# Patient Record
Sex: Female | Born: 1995 | Race: Black or African American | Hispanic: No | Marital: Single | Smoking: Never smoker
Health system: Southern US, Community
[De-identification: ages and names within clinical notes are randomized; demographics above are authoritative.]

---

## 2020-03-06 ENCOUNTER — Encounter (HOSPITAL_COMMUNITY): Payer: Self-pay

## 2020-03-06 ENCOUNTER — Other Ambulatory Visit: Payer: Self-pay

## 2020-03-06 ENCOUNTER — Emergency Department (HOSPITAL_COMMUNITY)
Admission: EM | Admit: 2020-03-06 | Discharge: 2020-03-06 | Disposition: A | Discharge status: Home / Self Care | Payer: BC Managed Care – PPO | Attending: Emergency Medicine | Admitting: Emergency Medicine

## 2020-03-06 DIAGNOSIS — S0181XA Laceration without foreign body of other part of head, initial encounter: Secondary | ICD-10-CM | POA: Insufficient documentation

## 2020-03-06 DIAGNOSIS — Y92410 Unspecified street and highway as the place of occurrence of the external cause: Secondary | ICD-10-CM | POA: Insufficient documentation

## 2020-03-06 MED ORDER — LIDOCAINE-EPINEPHRINE 1 %-1:100000 IJ SOLN
10.0000 mL | Freq: Once | INTRAMUSCULAR | Status: AC
  Administered 2020-03-06: 10 mL
  Filled 2020-03-06: qty 1

## 2020-03-06 NOTE — ED Notes (Addendum)
Patients mom Shelbie Proctor [phone]

## 2020-03-06 NOTE — ED Notes (Signed)
Pt DC home; will go to Urgent Care or return here for suture removal on Friday.
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
## 2020-03-14 ENCOUNTER — Ambulatory Visit (HOSPITAL_COMMUNITY): Admit: 2020-03-14 | Discharge status: Against Medical Advice | Payer: No Typology Code available for payment source

## 2020-03-15 ENCOUNTER — Other Ambulatory Visit: Payer: Self-pay

## 2020-03-15 ENCOUNTER — Ambulatory Visit (HOSPITAL_COMMUNITY)
Admission: EM | Admit: 2020-03-15 | Discharge: 2020-03-15 | Disposition: A | Discharge status: Home / Self Care | Payer: BC Managed Care – PPO | Attending: Family Medicine | Admitting: Family Medicine

## 2020-03-15 DIAGNOSIS — Z4802 Encounter for removal of sutures: Secondary | ICD-10-CM

## 2020-03-15 NOTE — ED Triage Notes (Signed)
Patient in for suture removal from forehead laceration. Denies drainage  Wound appears clean, dry, approximated. No drainage.  Daryll Drown, PA in to triage to evaluate wound. Healing well, no need for future tx

## 2020-05-01 ENCOUNTER — Encounter (HOSPITAL_COMMUNITY): Payer: Self-pay | Admitting: Emergency Medicine

## 2020-05-01 ENCOUNTER — Emergency Department (HOSPITAL_COMMUNITY)
Admission: EM | Admit: 2020-05-01 | Discharge: 2020-05-02 | Disposition: A | Discharge status: Home / Self Care | Payer: BC Managed Care – PPO | Attending: Emergency Medicine | Admitting: Emergency Medicine

## 2020-05-01 ENCOUNTER — Other Ambulatory Visit: Payer: Self-pay

## 2020-05-01 DIAGNOSIS — IMO0001 Reserved for inherently not codable concepts without codable children: Secondary | ICD-10-CM

## 2020-05-01 DIAGNOSIS — O2341 Unspecified infection of urinary tract in pregnancy, first trimester: Secondary | ICD-10-CM

## 2020-05-01 DIAGNOSIS — Z3A01 Less than 8 weeks gestation of pregnancy: Secondary | ICD-10-CM | POA: Diagnosis not present

## 2020-05-01 DIAGNOSIS — O219 Vomiting of pregnancy, unspecified: Secondary | ICD-10-CM | POA: Diagnosis not present

## 2020-05-01 DIAGNOSIS — O200 Threatened abortion: Secondary | ICD-10-CM | POA: Diagnosis not present

## 2020-05-01 DIAGNOSIS — O418X10 Other specified disorders of amniotic fluid and membranes, first trimester, not applicable or unspecified: Secondary | ICD-10-CM

## 2020-05-01 DIAGNOSIS — O4691 Antepartum hemorrhage, unspecified, first trimester: Secondary | ICD-10-CM | POA: Diagnosis present

## 2020-05-01 NOTE — ED Provider Notes (Addendum)
[facility]-EMERGENCY DEPT Provider Note: Monica Dell, MD, FACEP  CSN: [id number] MRN: [id number] ARRIVAL: 05/01/20 at 1920 ROOM: WA19/WA19   CHIEF COMPLAINT  Vaginal Bleeding   HISTORY OF PRESENT ILLNESS  05/01/20 11:40 PM Monica Payne is a 24 y.o. female who reports a positive pregnancy test a week ago.  She is here requesting a pregnancy test.  She is having vaginal bleeding, nausea and vomiting with this.  She denies any pain.  She states the vaginal bleeding was heavy.  Nothing makes her symptoms better or worse.  The patient's nurse reports her urine specimen was foul-smelling.   History reviewed. No pertinent past medical history.  History reviewed. No pertinent surgical history.  No family history on file.  Social History   Tobacco Use  . Smoking status: Not on file  Substance Use Topics  . Alcohol use: Not on file  . Drug use: Not on file    Prior to Admission medications   Medication Sig Start Date End Date Taking? Authorizing Provider  Prenatal Vit-Fe Fumarate-FA (PRENATAL VITAMINS) 28-0.8 MG TABS Take 1 tablet by mouth daily. 05/02/20   Lonie Rummell, Jonny Ruiz, MD    Allergies Patient has no known allergies.   REVIEW OF SYSTEMS  Negative except as noted here or in the History of Present Illness.   PHYSICAL EXAMINATION  Initial Vital Signs Blood pressure 108/75, pulse 94, temperature 98 F (36.7 C), temperature source Oral, resp. rate 16, last menstrual period 03/23/2020, SpO2 99 %.  Examination General: Well-developed, well-nourished female in no acute distress; appearance consistent with age of record HENT: normocephalic; atraumatic Eyes: pupils equal, round and reactive to light; extraocular muscles intact Neck: supple Heart: regular rate and rhythm Lungs: clear to auscultation bilaterally Abdomen: soft; nondistended; nontender; no masses or hepatosplenomegaly; bowel sounds present GU: Normal external genitalia; small amount of vaginal bleeding;  cervical os closed; no cervical motion tenderness; no adnexal tenderness Extremities: No deformity; full range of motion; pulses normal Neurologic: Awake, alert and oriented; motor function intact in all extremities and symmetric; no facial droop Skin: Warm and dry Psychiatric: Normal mood and affect   RESULTS  Summary of this visit's results, reviewed and interpreted by myself:   EKG Interpretation  Date/Time:    Ventricular Rate:    PR Interval:    QRS Duration:   QT Interval:    QTC Calculation:   R Axis:     Text Interpretation:        Laboratory Studies: Results for orders placed or performed during the hospital encounter of 05/01/20 (from the past 24 hour(s))  Urinalysis, Routine w reflex microscopic Urine, Clean Catch     Status: Abnormal   Collection Time: 05/01/20 11:45 PM  Result Value Ref Range   Color, Urine AMBER (A) YELLOW   APPearance HAZY (A) CLEAR   Specific Gravity, Urine 1.026 1.005 - 1.030   pH 5.0 5.0 - 8.0   Glucose, UA NEGATIVE NEGATIVE mg/dL   Hgb urine dipstick LARGE (A) NEGATIVE   Bilirubin Urine NEGATIVE NEGATIVE   Ketones, ur 20 (A) NEGATIVE mg/dL   Protein, ur 333 (A) NEGATIVE mg/dL   Nitrite POSITIVE (A) NEGATIVE   Leukocytes,Ua TRACE (A) NEGATIVE   RBC / HPF 0-5 0 - 5 RBC/hpf   WBC, UA 21-50 0 - 5 WBC/hpf   Bacteria, UA MANY (A) NONE SEEN   Squamous Epithelial / LPF 0-5 0 - 5   Mucus PRESENT    Hyaline Casts, UA PRESENT   Wet prep,  genital     Status: Abnormal   Collection Time: 05/01/20 11:50 PM  Result Value Ref Range   Yeast Wet Prep HPF POC NONE SEEN NONE SEEN   Trich, Wet Prep NONE SEEN NONE SEEN   Clue Cells Wet Prep HPF POC NONE SEEN NONE SEEN   WBC, Wet Prep HPF POC FEW (A) NONE SEEN   Sperm NONE SEEN   POC urine preg, ED     Status: Abnormal   Collection Time: 05/02/20 12:00 AM  Result Value Ref Range   Preg Test, Ur POSITIVE (A) NEGATIVE  ABO/Rh     Status: None   Collection Time: 05/02/20  1:11 AM  Result Value Ref  Range   ABO/RH(D)      A POS Performed at [facility], [address], [place], [place] [address]   hCG, quantitative, pregnancy     Status: Abnormal   Collection Time: 05/02/20  1:11 AM  Result Value Ref Range   hCG, Beta Chain, Quant, S 50,149 (H) <5 mIU/mL   Imaging Studies: US OB Comp Less 14 Wks  Result Date: 05/02/2020 CLINICAL DATA:  Vaginal bleeding, positive pregnancy test EXAM: OBSTETRIC <14 WK ULTRASOUND TECHNIQUE: Transabdominal ultrasound was performed for evaluation of the gestation as well as the maternal uterus and adnexal regions. COMPARISON:  None. FINDINGS: Intrauterine gestational sac: Present Yolk sac:  Present Embryo:  Present Cardiac Activity: Present Heart Rate: 120 bpm CRL:   4.5 mm   6 w 1 d                  [place] EDC: 12/25/2020 Subchorionic hemorrhage: There is a large subchorionic hemorrhage identified which measures 3.7 cm in greatest dimension. Maternal uterus/adnexae: Within normal limits. No free fluid is noted. IMPRESSION: Single live intrauterine gestation at 6 weeks 1 day. Large subchorionic hemorrhage measuring 3.7 cm in greatest dimension. Electronically Signed   By: Alcide Clever M.D.   On: 05/02/2020 01:55    ED COURSE and MDM  Nursing notes, initial and subsequent vitals signs, including pulse oximetry, reviewed and interpreted by myself.  Vitals:   05/01/20 1925 05/01/20 2216  BP: (!) 143/95 108/75  Pulse: 77 94  Resp: 16 16  Temp: 98 F (36.7 C)   TempSrc: Oral   SpO2: 99% 99%   Medications  fosfomycin (MONUROL) packet 3 g (has no administration in time range)    1:59 AM Fosfomycin ordered for urinary tract infection in pregnancy.  Pelvic ultrasound pending.  Patient is Rh+, not a Rhophylac candidate.    PROCEDURES  Procedures   ED DIAGNOSES     ICD-10-CM   1. Threatened miscarriage  O20.0   2. Subchorionic hemorrhage of placenta in first trimester, single or unspecified fetus  O41.8X10    O46.8X1   3. Urinary  tract infection in mother during first trimester of pregnancy  O23.41        Hanz Winterhalter, MD 05/02/20 0215    Paula Libra, MD 05/02/20 0230

## 2020-05-01 NOTE — ED Notes (Signed)
Pt given a urine cup and instructed to provide a specimen when ready.

## 2020-05-01 NOTE — ED Triage Notes (Signed)
Patient reports positive pregnancy test x1 week ago. Reports pink discharge today.

## 2020-05-02 ENCOUNTER — Emergency Department (HOSPITAL_COMMUNITY): Payer: BC Managed Care – PPO

## 2020-05-02 LAB — URINALYSIS, ROUTINE W REFLEX MICROSCOPIC
Bilirubin Urine: NEGATIVE
Glucose, UA: NEGATIVE mg/dL
Ketones, ur: 20 mg/dL — AB
Nitrite: POSITIVE — AB
Protein, ur: 100 mg/dL — AB
Specific Gravity, Urine: 1.026 (ref 1.005–1.030)
pH: 5 (ref 5.0–8.0)

## 2020-05-02 LAB — POC URINE PREG, ED: Preg Test, Ur: POSITIVE — AB

## 2020-05-02 LAB — GC/CHLAMYDIA PROBE AMP (~~LOC~~) NOT AT ARMC
Chlamydia: NEGATIVE
Comment: NEGATIVE
Comment: NORMAL
Neisseria Gonorrhea: NEGATIVE

## 2020-05-02 LAB — ABO/RH: ABO/RH(D): A POS

## 2020-05-02 LAB — WET PREP, GENITAL
Clue Cells Wet Prep HPF POC: NONE SEEN
Sperm: NONE SEEN
Trich, Wet Prep: NONE SEEN
Yeast Wet Prep HPF POC: NONE SEEN

## 2020-05-02 LAB — HCG, QUANTITATIVE, PREGNANCY: hCG, Beta Chain, Quant, S: 50149 m[IU]/mL — ABNORMAL HIGH (ref <5)

## 2020-05-02 MED ORDER — PRENATAL VITAMINS 28-0.8 MG PO TABS: 1.0000 | ORAL_TABLET | Freq: Every day | ORAL | 0 refills | Status: AC

## 2020-05-02 MED ORDER — FOSFOMYCIN TROMETHAMINE 3 G PO PACK
3.0000 g | PACK | Freq: Once | ORAL | Status: AC
  Administered 2020-05-02: 3 g via ORAL
  Filled 2020-05-02: qty 3

## 2020-05-04 LAB — URINE CULTURE: Culture: >=100000 — AB

## 2020-05-05 ENCOUNTER — Telehealth: Payer: Self-pay

## 2020-05-05 NOTE — Telephone Encounter (Signed)
Post ED Visit - Positive Culture Follow-up  Culture report reviewed by antimicrobial stewardship pharmacist: [facility] []  , Pharm.D. []  Enzo Bi, Pharm.D., BCPS AQ-ID []  , Pharm.D., BCPS []  Celedonio Miyamoto, Pharm.D., BCPS []  [place], Garvin Fila.D., BCPS, AAHIVP []  , Pharm.D., BCPS, AAHIVP []  Georgina Pillion, PharmD, BCPS []  , PharmD, BCPS []  [place], PharmD, BCPS []  [address], PharmD []  , PharmD, BCPS []  Estella Husk, PharmD  Pharmacy Team []  Lysle Pearl, PharmD []  , PharmD []  Phillips Climes, PharmD []  , Rph []  Agapito Games) , PharmD []  Verlan Friends, PharmD []  , PharmD []  Mervyn Gay, PharmD []  , PharmD []  Vinnie Level, PharmD []  [facility], PharmD []  , PharmD [x]  Len Childs, PharmD   Positive urine culture Treated with Fosfomycin, organism sensitive to the same and no further patient follow-up is required at this time.  05/05/2020, 11:35 AM

## 2020-05-26 LAB — OB RESULTS CONSOLE RUBELLA ANTIBODY, IGM: Rubella: IMMUNE

## 2020-05-26 LAB — OB RESULTS CONSOLE GC/CHLAMYDIA
Chlamydia: NEGATIVE
Gonorrhea: NEGATIVE

## 2020-05-26 LAB — OB RESULTS CONSOLE ABO/RH: RH Type: POSITIVE

## 2020-05-26 LAB — OB RESULTS CONSOLE RPR: RPR: NONREACTIVE

## 2020-05-26 LAB — OB RESULTS CONSOLE ANTIBODY SCREEN: Antibody Screen: NEGATIVE

## 2020-05-26 LAB — OB RESULTS CONSOLE HIV ANTIBODY (ROUTINE TESTING): HIV: NONREACTIVE

## 2020-05-26 LAB — OB RESULTS CONSOLE HEPATITIS B SURFACE ANTIGEN: Hepatitis B Surface Ag: NEGATIVE

## 2020-11-22 LAB — OB RESULTS CONSOLE GBS: GBS: NEGATIVE

## 2020-12-20 ENCOUNTER — Inpatient Hospital Stay (HOSPITAL_BASED_OUTPATIENT_CLINIC_OR_DEPARTMENT_OTHER): Payer: BC Managed Care – PPO

## 2020-12-20 ENCOUNTER — Inpatient Hospital Stay (HOSPITAL_COMMUNITY): Payer: BC Managed Care – PPO | Admitting: Anesthesiology

## 2020-12-20 ENCOUNTER — Encounter (HOSPITAL_COMMUNITY)
Admission: AD | Disposition: A | Discharge status: Home / Self Care | Payer: Self-pay | Source: Home / Self Care | Attending: Obstetrics and Gynecology

## 2020-12-20 ENCOUNTER — Encounter (HOSPITAL_COMMUNITY): Payer: Self-pay | Admitting: *Deleted

## 2020-12-20 ENCOUNTER — Encounter (HOSPITAL_COMMUNITY): Payer: Self-pay | Admitting: Anesthesiology

## 2020-12-20 ENCOUNTER — Telehealth (HOSPITAL_COMMUNITY): Payer: Self-pay | Admitting: *Deleted

## 2020-12-20 ENCOUNTER — Other Ambulatory Visit (HOSPITAL_COMMUNITY): Payer: BC Managed Care – PPO | Attending: Obstetrics and Gynecology

## 2020-12-20 ENCOUNTER — Other Ambulatory Visit: Payer: Self-pay

## 2020-12-20 ENCOUNTER — Inpatient Hospital Stay (HOSPITAL_COMMUNITY)
Admission: AD | Admit: 2020-12-20 | Discharge: 2020-12-23 | DRG: 788 | Disposition: A | Discharge status: Home / Self Care | Payer: BC Managed Care – PPO | Attending: Obstetrics and Gynecology | Admitting: Obstetrics and Gynecology

## 2020-12-20 ENCOUNTER — Encounter (HOSPITAL_COMMUNITY): Payer: Self-pay | Admitting: Obstetrics and Gynecology

## 2020-12-20 DIAGNOSIS — O26893 Other specified pregnancy related conditions, third trimester: Secondary | ICD-10-CM | POA: Diagnosis present

## 2020-12-20 DIAGNOSIS — Z20822 Contact with and (suspected) exposure to covid-19: Secondary | ICD-10-CM | POA: Diagnosis present

## 2020-12-20 DIAGNOSIS — O321XX0 Maternal care for breech presentation, not applicable or unspecified: Secondary | ICD-10-CM | POA: Diagnosis present

## 2020-12-20 DIAGNOSIS — Z3A40 40 weeks gestation of pregnancy: Secondary | ICD-10-CM | POA: Diagnosis not present

## 2020-12-20 DIAGNOSIS — Z3689 Encounter for other specified antenatal screening: Secondary | ICD-10-CM

## 2020-12-20 LAB — CBC
HCT: 35.6 % — ABNORMAL LOW (ref 36.0–46.0)
Hemoglobin: 12.2 g/dL (ref 12.0–15.0)
MCH: 29.8 pg (ref 26.0–34.0)
MCHC: 34.3 g/dL (ref 30.0–36.0)
MCV: 87 fL (ref 80.0–100.0)
Platelets: 157 10*3/uL (ref 150–400)
RBC: 4.09 MIL/uL (ref 3.87–5.11)
RDW: 13 % (ref 11.5–15.5)
WBC: 8.3 10*3/uL (ref 4.0–10.5)
nRBC: 0 % (ref 0.0–0.2)

## 2020-12-20 LAB — TYPE AND SCREEN
ABO/RH(D): A POS
Antibody Screen: NEGATIVE

## 2020-12-20 LAB — RESP PANEL BY RT-PCR (FLU A&B, COVID) ARPGX2
Influenza A by PCR: NEGATIVE
Influenza B by PCR: NEGATIVE
SARS Coronavirus 2 by RT PCR: NEGATIVE

## 2020-12-20 SURGERY — Surgical Case
Anesthesia: Spinal

## 2020-12-20 MED ORDER — KETOROLAC TROMETHAMINE 30 MG/ML IJ SOLN: 30.0000 mg | Freq: Four times a day (QID) | INTRAMUSCULAR | Status: DC | PRN

## 2020-12-20 MED ORDER — DIPHENHYDRAMINE HCL 50 MG/ML IJ SOLN: 12.5000 mg | INTRAMUSCULAR | Status: DC | PRN

## 2020-12-20 MED ORDER — MENTHOL 3 MG MT LOZG: 1.0000 | LOZENGE | OROMUCOSAL | Status: DC | PRN

## 2020-12-20 MED ORDER — NALBUPHINE HCL 10 MG/ML IJ SOLN: 5.0000 mg | INTRAMUSCULAR | Status: DC | PRN

## 2020-12-20 MED ORDER — ACETAMINOPHEN 500 MG PO TABS
1000.0000 mg | ORAL_TABLET | Freq: Four times a day (QID) | ORAL | Status: DC
  Administered 2020-12-20 – 2020-12-23 (×10): 1000 mg via ORAL
  Filled 2020-12-20 (×11): qty 2

## 2020-12-20 MED ORDER — OXYCODONE-ACETAMINOPHEN 5-325 MG PO TABS: 1.0000 | ORAL_TABLET | ORAL | Status: DC | PRN

## 2020-12-20 MED ORDER — IBUPROFEN 600 MG PO TABS
600.0000 mg | ORAL_TABLET | Freq: Four times a day (QID) | ORAL | Status: DC
  Administered 2020-12-21 – 2020-12-23 (×7): 600 mg via ORAL
  Filled 2020-12-20 (×8): qty 1

## 2020-12-20 MED ORDER — KETOROLAC TROMETHAMINE 30 MG/ML IJ SOLN
30.0000 mg | Freq: Four times a day (QID) | INTRAMUSCULAR | Status: AC
  Administered 2020-12-20 – 2020-12-21 (×3): 30 mg via INTRAVENOUS
  Filled 2020-12-20 (×3): qty 1

## 2020-12-20 MED ORDER — ZOLPIDEM TARTRATE 5 MG PO TABS: 5.0000 mg | ORAL_TABLET | Freq: Every evening | ORAL | Status: DC | PRN

## 2020-12-20 MED ORDER — OXYCODONE HCL 5 MG PO TABS
5.0000 mg | ORAL_TABLET | ORAL | Status: DC | PRN
  Administered 2020-12-21 – 2020-12-22 (×3): 5 mg via ORAL
  Filled 2020-12-20 (×4): qty 1

## 2020-12-20 MED ORDER — WITCH HAZEL-GLYCERIN EX PADS: 1.0000 "application " | MEDICATED_PAD | CUTANEOUS | Status: DC | PRN

## 2020-12-20 MED ORDER — LACTATED RINGERS IV SOLN: INTRAVENOUS | Status: DC

## 2020-12-20 MED ORDER — MORPHINE SULFATE (PF) 0.5 MG/ML IJ SOLN
INTRAMUSCULAR | Status: AC
Start: 2020-12-20 — End: ?
  Filled 2020-12-20: qty 10

## 2020-12-20 MED ORDER — OXYTOCIN-SODIUM CHLORIDE 30-0.9 UT/500ML-% IV SOLN
2.5000 [IU]/h | INTRAVENOUS | Status: DC
  Filled 2020-12-20: qty 500

## 2020-12-20 MED ORDER — ONDANSETRON HCL 4 MG/2ML IJ SOLN: 4.0000 mg | Freq: Three times a day (TID) | INTRAMUSCULAR | Status: DC | PRN

## 2020-12-20 MED ORDER — OXYTOCIN-SODIUM CHLORIDE 30-0.9 UT/500ML-% IV SOLN
INTRAVENOUS | Status: DC | PRN
  Administered 2020-12-20: 30 [IU] via INTRAVENOUS

## 2020-12-20 MED ORDER — LIDOCAINE HCL (PF) 1 % IJ SOLN: 30.0000 mL | INTRAMUSCULAR | Status: DC | PRN

## 2020-12-20 MED ORDER — SCOPOLAMINE 1 MG/3DAYS TD PT72
1.0000 | MEDICATED_PATCH | TRANSDERMAL | Status: DC
  Administered 2020-12-20: 1.5 mg via TRANSDERMAL
  Filled 2020-12-20: qty 1

## 2020-12-20 MED ORDER — NALBUPHINE HCL 10 MG/ML IJ SOLN: 5.0000 mg | Freq: Once | INTRAMUSCULAR | Status: DC | PRN

## 2020-12-20 MED ORDER — ONDANSETRON HCL 4 MG/2ML IJ SOLN: 4.0000 mg | Freq: Four times a day (QID) | INTRAMUSCULAR | Status: DC | PRN

## 2020-12-20 MED ORDER — SIMETHICONE 80 MG PO CHEW: 80.0000 mg | CHEWABLE_TABLET | ORAL | Status: DC | PRN

## 2020-12-20 MED ORDER — DIPHENHYDRAMINE HCL 25 MG PO CAPS: 25.0000 mg | ORAL_CAPSULE | ORAL | Status: DC | PRN

## 2020-12-20 MED ORDER — NALOXONE HCL 0.4 MG/ML IJ SOLN: 0.4000 mg | INTRAMUSCULAR | Status: DC | PRN

## 2020-12-20 MED ORDER — LACTATED RINGERS IV SOLN: 500.0000 mL | INTRAVENOUS | Status: DC | PRN

## 2020-12-20 MED ORDER — TETANUS-DIPHTH-ACELL PERTUSSIS 5-2.5-18.5 LF-MCG/0.5 IM SUSY: 0.5000 mL | PREFILLED_SYRINGE | Freq: Once | INTRAMUSCULAR | Status: DC

## 2020-12-20 MED ORDER — FENTANYL CITRATE (PF) 100 MCG/2ML IJ SOLN
INTRAMUSCULAR | Status: DC | PRN
  Administered 2020-12-20: 15 ug via INTRATHECAL

## 2020-12-20 MED ORDER — PRENATAL MULTIVITAMIN CH
1.0000 | ORAL_TABLET | Freq: Every day | ORAL | Status: DC
  Administered 2020-12-21 – 2020-12-23 (×3): 1 via ORAL
  Filled 2020-12-20 (×3): qty 1

## 2020-12-20 MED ORDER — DIPHENHYDRAMINE HCL 25 MG PO CAPS: 25.0000 mg | ORAL_CAPSULE | Freq: Four times a day (QID) | ORAL | Status: DC | PRN

## 2020-12-20 MED ORDER — PHENYLEPHRINE HCL-NACL 20-0.9 MG/250ML-% IV SOLN
INTRAVENOUS | Status: AC
  Filled 2020-12-20: qty 250

## 2020-12-20 MED ORDER — OXYTOCIN BOLUS FROM INFUSION: 333.0000 mL | Freq: Once | INTRAVENOUS | Status: DC

## 2020-12-20 MED ORDER — SIMETHICONE 80 MG PO CHEW
80.0000 mg | CHEWABLE_TABLET | Freq: Three times a day (TID) | ORAL | Status: DC
  Administered 2020-12-21 – 2020-12-23 (×4): 80 mg via ORAL
  Filled 2020-12-20 (×4): qty 1

## 2020-12-20 MED ORDER — BUPIVACAINE IN DEXTROSE 0.75-8.25 % IT SOLN
INTRATHECAL | Status: DC | PRN
  Administered 2020-12-20: 1.6 mg via INTRATHECAL

## 2020-12-20 MED ORDER — ONDANSETRON HCL 4 MG/2ML IJ SOLN
INTRAMUSCULAR | Status: DC | PRN
  Administered 2020-12-20: 4 mg via INTRAVENOUS

## 2020-12-20 MED ORDER — OXYTOCIN-SODIUM CHLORIDE 30-0.9 UT/500ML-% IV SOLN
2.5000 [IU]/h | INTRAVENOUS | Status: AC
  Administered 2020-12-20: 2.5 [IU]/h via INTRAVENOUS
  Filled 2020-12-20: qty 500

## 2020-12-20 MED ORDER — SODIUM CHLORIDE 0.9% FLUSH: 3.0000 mL | INTRAVENOUS | Status: DC | PRN

## 2020-12-20 MED ORDER — PHENYLEPHRINE HCL-NACL 20-0.9 MG/250ML-% IV SOLN
INTRAVENOUS | Status: DC | PRN
Start: 2020-12-20 — End: 2020-12-20
  Administered 2020-12-20: 60 ug/min via INTRAVENOUS

## 2020-12-20 MED ORDER — CEFAZOLIN SODIUM-DEXTROSE 2-4 GM/100ML-% IV SOLN
INTRAVENOUS | Status: AC
  Filled 2020-12-20: qty 100

## 2020-12-20 MED ORDER — CEFAZOLIN SODIUM-DEXTROSE 2-3 GM-%(50ML) IV SOLR
INTRAVENOUS | Status: DC | PRN
  Administered 2020-12-20: 2 g via INTRAVENOUS

## 2020-12-20 MED ORDER — DEXAMETHASONE SODIUM PHOSPHATE 4 MG/ML IJ SOLN
INTRAMUSCULAR | Status: AC
  Filled 2020-12-20: qty 2

## 2020-12-20 MED ORDER — ONDANSETRON HCL 4 MG/2ML IJ SOLN
INTRAMUSCULAR | Status: AC
  Filled 2020-12-20: qty 2

## 2020-12-20 MED ORDER — NALOXONE HCL 4 MG/10ML IJ SOLN
1.0000 ug/kg/h | INTRAVENOUS | Status: DC | PRN
  Filled 2020-12-20: qty 5

## 2020-12-20 MED ORDER — OXYCODONE-ACETAMINOPHEN 5-325 MG PO TABS: 2.0000 | ORAL_TABLET | ORAL | Status: DC | PRN

## 2020-12-20 MED ORDER — MORPHINE SULFATE (PF) 0.5 MG/ML IJ SOLN
INTRAMUSCULAR | Status: DC | PRN
  Administered 2020-12-20: .15 mg via INTRATHECAL

## 2020-12-20 MED ORDER — SENNOSIDES-DOCUSATE SODIUM 8.6-50 MG PO TABS
2.0000 | ORAL_TABLET | Freq: Every day | ORAL | Status: DC
  Administered 2020-12-21 – 2020-12-23 (×3): 2 via ORAL
  Filled 2020-12-20 (×3): qty 2

## 2020-12-20 MED ORDER — DEXAMETHASONE SODIUM PHOSPHATE 4 MG/ML IJ SOLN
INTRAMUSCULAR | Status: DC | PRN
  Administered 2020-12-20: 8 mg via INTRAVENOUS

## 2020-12-20 MED ORDER — DIBUCAINE (PERIANAL) 1 % EX OINT: 1.0000 "application " | TOPICAL_OINTMENT | CUTANEOUS | Status: DC | PRN

## 2020-12-20 MED ORDER — COCONUT OIL OIL
1.0000 "application " | TOPICAL_OIL | Status: DC | PRN
  Administered 2020-12-21: 1 via TOPICAL

## 2020-12-20 MED ORDER — ACETAMINOPHEN 325 MG PO TABS: 650.0000 mg | ORAL_TABLET | ORAL | Status: DC | PRN

## 2020-12-20 MED ORDER — SOD CITRATE-CITRIC ACID 500-334 MG/5ML PO SOLN
30.0000 mL | ORAL | Status: DC | PRN
  Filled 2020-12-20: qty 30

## 2020-12-20 MED ORDER — FENTANYL CITRATE (PF) 100 MCG/2ML IJ SOLN
INTRAMUSCULAR | Status: AC
  Filled 2020-12-20: qty 2

## 2020-12-20 SURGICAL SUPPLY — 36 items
CHLORAPREP W/TINT 26ML (MISCELLANEOUS) ×2 IMPLANT
CLAMP CORD UMBIL (MISCELLANEOUS) IMPLANT
CLOTH BEACON ORANGE TIMEOUT ST (SAFETY) ×2 IMPLANT
DERMABOND ADVANCED (GAUZE/BANDAGES/DRESSINGS) ×1
DERMABOND ADVANCED .7 DNX12 (GAUZE/BANDAGES/DRESSINGS) ×1 IMPLANT
DRSG OPSITE POSTOP 4X10 (GAUZE/BANDAGES/DRESSINGS) ×2 IMPLANT
ELECT REM PT RETURN 9FT ADLT (ELECTROSURGICAL) ×2
ELECTRODE REM PT RTRN 9FT ADLT (ELECTROSURGICAL) ×1 IMPLANT
EXTRACTOR VACUUM BELL STYLE (SUCTIONS) IMPLANT
GAUZE SPONGE 4X4 12PLY STRL LF (GAUZE/BANDAGES/DRESSINGS) IMPLANT
GLOVE BIO SURGEON STRL SZ 6 (GLOVE) ×2 IMPLANT
GLOVE BIOGEL PI IND STRL 6.5 (GLOVE) ×1 IMPLANT
GLOVE BIOGEL PI IND STRL 7.0 (GLOVE) ×2 IMPLANT
GLOVE BIOGEL PI INDICATOR 6.5 (GLOVE) ×1
GLOVE BIOGEL PI INDICATOR 7.0 (GLOVE) ×2
GOWN STRL REUS W/TWL LRG LVL3 (GOWN DISPOSABLE) ×4 IMPLANT
KIT ABG SYR 3ML LUER SLIP (SYRINGE) IMPLANT
NEEDLE HYPO 25X5/8 SAFETYGLIDE (NEEDLE) IMPLANT
NS IRRIG 1000ML POUR BTL (IV SOLUTION) ×2 IMPLANT
PACK C SECTION WH (CUSTOM PROCEDURE TRAY) ×2 IMPLANT
PAD ABD 8X10 STRL (GAUZE/BANDAGES/DRESSINGS) IMPLANT
PAD OB MATERNITY 4.3X12.25 (PERSONAL CARE ITEMS) ×2 IMPLANT
PENCIL SMOKE EVAC W/HOLSTER (ELECTROSURGICAL) ×2 IMPLANT
RTRCTR C-SECT PINK 25CM LRG (MISCELLANEOUS) IMPLANT
SUT PDS AB 0 CTX 36 PDP370T (SUTURE) IMPLANT
SUT PLAIN 2 0 (SUTURE)
SUT PLAIN ABS 2-0 CT1 27XMFL (SUTURE) IMPLANT
SUT VIC AB 0 CT1 36 (SUTURE) ×4 IMPLANT
SUT VIC AB 2-0 CT1 27 (SUTURE) ×1
SUT VIC AB 2-0 CT1 TAPERPNT 27 (SUTURE) ×1 IMPLANT
SUT VIC AB 3-0 SH 27 (SUTURE)
SUT VIC AB 3-0 SH 27X BRD (SUTURE) IMPLANT
SUT VIC AB 4-0 KS 27 (SUTURE) ×2 IMPLANT
TOWEL OR 17X24 6PK STRL BLUE (TOWEL DISPOSABLE) ×2 IMPLANT
TRAY FOLEY W/BAG SLVR 14FR LF (SET/KITS/TRAYS/PACK) ×2 IMPLANT
WATER STERILE IRR 1000ML POUR (IV SOLUTION) ×2 IMPLANT

## 2020-12-20 NOTE — H&P (Addendum)
Monica Payne is a 25 y.o. female presenting for contractions. +FM< denies VB, LOF. CTX q6-15m  Seen in office this AM and called 6cm/vtx  PNC essentially uncomplicated, GBS neg, baby girl.   OB History     Gravida  1   Para      Term      Preterm      AB      Living         SAB      IAB      Ectopic      Multiple      Live Births             History reviewed. No pertinent past medical history. History reviewed. No pertinent surgical history. Family History: family history is not on file. Social History:  reports that she has never smoked. She has never used smokeless tobacco. No history on file for alcohol use and drug use.     Maternal Diabetes: No1hr 42 Genetic Screening: Declined Maternal Ultrasounds/Referrals: Normal Fetal Ultrasounds or other Referrals:  None Maternal Substance Abuse:  No Significant Maternal Medications:  None Significant Maternal Lab Results:  Group B Strep negative Other Comments:  None  Review of Systems History Dilation: 9 Effacement (%): 100 Station: -1 Exam by:: Lianna Sitzmann, MD  BSUS: SIUP in frank breech, LE on patient's right  Blood pressure (!) 138/97, pulse 84, temperature 97.8 F (36.6 C), resp. rate 20, height 5\' 5"  (1.651 m), weight 82 kg, last menstrual period 03/23/2020, SpO2 100 %. Exam Physical Exam  Prenatal labs: ABO, Rh: --/--/PENDING (07/19 1437) Antibody: PENDING (07/19 1437) Rubella: Immune (12/23 0000) RPR: Nonreactive (12/23 0000)  HBsAg: Negative (12/23 0000)  HIV: Non-reactive (12/23 0000)  GBS: Negative/-- (06/21 0000)   Assessment/Plan: This is a 24yo G3P0020 @ 40 1/7 by 10wk scan NOT c/w LMP admitted in active labor. GBS neg, pNC essentially uncomplicated.   On examination in MAU, patient found to be 9cm and frank breech. Labs have only just been drawn, COVID screen pending. Reviewed with patient that initial delivery mode would be PLTCS, at this point would be stat as labs have not  yet resulted, meaning patient would be put under general. Patient is declining this at this time, would like to attempt vaginal breech delivery. Reviewed possible complications including cord prolapse, head entrapment. Additionally, planned vaginal breech usually reserved for proven pelvis. Patient still declining section at this time. Reviewed that if there is any concern regarding descent, FHR tracing or if patient changes her mind, will proceed immediately with PLTCS. Patient would be amenable at that time  3/7 is present with patient as well as her mother and FOB. All questions answered  Beulah Gandy 12/20/2020, 3:24 PM

## 2020-12-20 NOTE — Progress Notes (Signed)
Patient attempted hands and knees during contractions, had a 16m decel to 90s, resolved with positioning back to lithotomy. Repeat CE shows unchanged dilation however funic presentation is noted. Reviewed with patient that this precludes vaginal delivery, she is amenable to PLTCS at this time. Spoke with anesthesia, appreciate help, amenable to spinal anesthesia for delivery, no general at this time R/B/A of cesarean section discussed with patient. Alternative would be vaginal delivery which would mean shorter postpartum stay and decreased risk of bleeding. Risks of section include infection of the uterus, pelvic organs, or skin, inadvertent injury to internal organs, such as bowel or bladder. If there is major injury, extensive surgery may be required. If injury is minor, it may be treated with relative ease. Discussed possibility of excessive blood loss and transfusion. If bleeding cannot be controlled using medical or minor surgical methods, a cesarean hysterectomy may be performed which would mean no future fertility. Patient accepts the possibility of blood transfusion, if necessary. Patient understands and agrees to move forward with section.

## 2020-12-20 NOTE — Transfer of Care (Signed)
Immediate Anesthesia Transfer of Care Note  Patient: Monica Payne [facility]  Procedure(s) Performed: CESAREAN SECTION  Patient Location: PACU  Anesthesia Type:Spinal  Level of Consciousness: awake  Airway & Oxygen Therapy: Patient Spontanous Breathing  Post-op Assessment: Report given to RN and Post -op Vital signs reviewed and stable  Post vital signs: Reviewed and stable  Last Vitals:  Vitals Value Taken Time  BP 109/65 12/20/20 1715  Temp    Pulse 109 12/20/20 1720  Resp 27 12/20/20 1720  SpO2 98 % 12/20/20 1720  Vitals shown include unvalidated device data.  Last Pain:  Vitals:   12/20/20 1453  PainSc: 5          Complications: No notable events documented.

## 2020-12-20 NOTE — Telephone Encounter (Signed)
Preadmission screen  

## 2020-12-20 NOTE — Lactation Note (Signed)
This note was copied from a baby's chart. Lactation Consultation Note  Patient Name: Monica Payne Today's Date: 12/20/2020 Reason for consult: Initial assessment;Mother's request;Difficult latch;Primapara;1st time breastfeeding;Term Age:25  Mom stated infant 30 minute feeding before LC arrival. Infant still cueing trying to latch. LC examined infant noting a  high palate, thick labial attachment and anterior lingual attachment. Infant bunching tongue up, following suck training able to extend tongue pass the gum line and audible swallows noted with change in position.   Mother had compression stripe but no other signs of trauma. Mother Doula present in the midst of the visit.   Plan. 1 Feed based on cues 8-12x in 24 hr period no more than 4 hrs without a feeding. Mom to offer both breasts, STS and listen for audible swallows.  2. If unable to latch, Mom to do hand expression and offer drops of colostrum via spoon.  3. I and O sheet reviewed.  4. LC brochure of inpatient and outpatient services provided.   Maternal Data Has patient been taught Hand Expression?: Yes  Feeding Mother's Current Feeding Choice: Breast Milk  LATCH Score Latch: Repeated attempts needed to sustain latch, nipple held in mouth throughout feeding, stimulation needed to elicit sucking reflex.  Audible Swallowing: A few with stimulation  Type of Nipple: Everted at rest and after stimulation  Comfort (Breast/Nipple): Soft / non-tender  Hold (Positioning): Assistance needed to correctly position infant at breast and maintain latch.  LATCH Score: 7   Lactation Tools Discussed/Used    Interventions Interventions: Breast feeding basics reviewed;Support pillows;Education;Assisted with latch;Position options;Skin to skin;Expressed milk;Breast massage;Hand express;Breast compression;Adjust position  Discharge    Consult Status Consult Status: Follow-up Date: 12/21/20 Follow-up type:  In-patient    Sladen Plancarte  Nicholson-Springer 12/20/2020, 8:44 PM

## 2020-12-20 NOTE — Op Note (Addendum)
C-Section Operative Note  Date: 12/20/20  Preoperative Diagnosis: IUP @ 40 1/7 Postoperative Diagnosis: Same as above Indications: Monica Payne breech with funic presentation Findings: Viable female with APGARS of 8 and 9 at 1 and 5 minutes, respectively. Normal appearing uterus, bilateral fallopian tubes and ovaries. Specimens: Heavily calcified placenta to pathology EBL 700cc IVF 2L UOP 250cc clear yellow urine  Patient Course: Monica Payne was admitted in active labor initially 9cm with bulging bag and frank breech presentation. Desired and counseled for vaginal attempt however soon after admission, had 40m decel with spontaneous return to baseline. Repeat exam notable for funic presentation. Because of this, patient consented for PLTCS.   Consent:  R/B/A of cesarean section discussed with patient. Alternative would be vaginal delivery which would mean shorter postpartum stay and decreased risk of bleeding. Risks of section include infection of the uterus, pelvic organs, or skin, inadvertent injury to internal organs, such as bowel or bladder. If there is major injury, extensive surgery may be required. If injury is minor, it may be treated with relative ease. Discussed possibility of excessive blood loss and transfusion. If bleeding cannot be controlled using medical or minor surgical methods, a cesarean hysterectomy may be performed which would mean no future fertility. Patient accepts the possibility of blood transfusion, if necessary. Patient understands and agrees to move forward with section.   Operative Procedure: Patient was taken to the operating room where epidural anesthesia was found to be adequate by Allis clamp test. She was prepped and draped in the normal sterile fashion in the dorsal supine position with a leftward tilt. An appropriate time out was performed. A Pfannenstiel skin incision was then made with the scalpel and carried through to the underlying layer of fascia by sharp  dissection and Bovie cautery. The fascia was nicked in the midline and the incision was extended laterally with Mayo scissors The superior aspect of the incision was grasped Coker clamps and dissected off the underlying rectus muscles. In a similar fashion the inferior aspect was dissected off the rectus muscles. Rectus muscles were separated in the midline and the peritoneal cavity entered bluntly. The peritoneal incision was then extended both superiorly and inferiorly with careful attention to avoid both bowel and bladder. The Alexis self-retaining wound retractor was then placed within the incision and the lower uterine segment exposed. The bladder flap was developed with Metzenbaum scissors and pushed away from the lower uterine segment. The lower uterine segment was then incised in a low transverse fashion and the cavity itself entered bluntly. The incision was extended bluntly. Amniotic sac was ruptured and fluid was noted to be clear in color. Fetal buttocks palpated with pulsating cord notable immediately inferior consistent with funic presentation. Fetal buttocks elevated from incision then BL LE delivered with fundal pressure plus Pinard maneuver. A towel was then used to grasp infant and gently rotate until bilateral scapula were visible. BL UE swept  across thorax and delivered through. Using MSV maneuver, fetal head delivered and the nose and mouth bulb suctioned with the cord clamped and cut as well. Tight nuchal was noted, and relieved after delivery. The infant was handed off to NICU. The placenta was then spontaneously expressed from the uterus and the uterus cleared of all clots and debris with moist lap sponge. The uterine incision was then repaired in 2 layers the first layer was a running locked layer of 0-vicryl and the second an imbricating layer of the same suture. The tubes and ovaries were inspected and the  gutters cleared of all clots and debris. The uterine incision was inspected and  found to be hemostatic. All instruments and sponges as well as the Alexis retractor were then removed from the abdomen. The rectus muscles were then reapproximated with several interrupted mattress sutures of 2-0 Vicryl. The fascia was then closed with 0 Vicryl in a running fashion. The skin was closed with a subcuticular stitch of 4-0 Vicryl on a Keith needle and then reinforced with Dermabond and a Honeycomb dressing. At the conclusion of the procedure all instruments and sponge counts were correct. Patient was taken to the recovery room in good condition with her baby accompanying her skin to skin.

## 2020-12-20 NOTE — Anesthesia Procedure Notes (Signed)
Spinal  Patient location during procedure: OR Start time: 12/20/2020 4:04 PM End time: 12/20/2020 4:06 PM Reason for block: surgical anesthesia Staffing Performed: anesthesiologist  Anesthesiologist: Mal Amabile, MD Preanesthetic Checklist Completed: patient identified, IV checked, site marked, risks and benefits discussed, surgical consent, monitors and equipment checked, pre-op evaluation and timeout performed Spinal Block Patient position: sitting Prep: DuraPrep and site prepped and draped Patient monitoring: heart rate, cardiac monitor, continuous pulse ox and blood pressure Approach: midline Location: L3-4 Injection technique: single-shot Needle Needle type: Pencan  Needle gauge: 24 G Needle length: 9 cm Needle insertion depth: 6 cm Assessment Sensory level: T4 Events: CSF return Additional Notes Patient tolerated procedure well. Adequate sensory level.

## 2020-12-20 NOTE — Anesthesia Preprocedure Evaluation (Addendum)
Anesthesia Evaluation  Patient identified by MRN, date of birth, ID band Patient awake    Reviewed: Unable to perform ROS - Chart review onlyPreop documentation limited or incomplete due to emergent nature of procedure.  Airway Mallampati: II  TM Distance: >3 FB Neck ROM: Full    Dental  (+) Teeth Intact   Pulmonary    Pulmonary exam normal breath sounds clear to auscultation       Cardiovascular Normal cardiovascular exam Rhythm:Regular Rate:Normal     Neuro/Psych    GI/Hepatic   Endo/Other    Renal/GU      Musculoskeletal   Abdominal   Peds  Hematology   Anesthesia Other Findings   Reproductive/Obstetrics (+) Pregnancy Homero Fellers Breech presentation                             Anesthesia Physical Anesthesia Plan  ASA: 2 and emergent  Anesthesia Plan: Spinal   Post-op Pain Management:    Induction:   PONV Risk Score and Plan: Treatment may vary due to age or medical condition and Scopolamine patch - Pre-op  Airway Management Planned: Natural Airway  Additional Equipment:   Intra-op Plan:   Post-operative Plan:   Informed Consent: I have reviewed the patients History and Physical, chart, labs and discussed the procedure including the risks, benefits and alternatives for the proposed anesthesia with the patient or authorized representative who has indicated his/her understanding and acceptance.       Plan Discussed with: Anesthesiologist and CRNA  Anesthesia Plan Comments:         Anesthesia Quick Evaluation

## 2020-12-21 ENCOUNTER — Encounter (HOSPITAL_COMMUNITY): Payer: Self-pay | Admitting: Obstetrics and Gynecology

## 2020-12-21 LAB — CBC
HCT: 28 % — ABNORMAL LOW (ref 36.0–46.0)
Hemoglobin: 9.4 g/dL — ABNORMAL LOW (ref 12.0–15.0)
MCH: 29.3 pg (ref 26.0–34.0)
MCHC: 33.6 g/dL (ref 30.0–36.0)
MCV: 87.2 fL (ref 80.0–100.0)
Platelets: 141 10*3/uL — ABNORMAL LOW (ref 150–400)
RBC: 3.21 MIL/uL — ABNORMAL LOW (ref 3.87–5.11)
RDW: 13 % (ref 11.5–15.5)
WBC: 14.4 10*3/uL — ABNORMAL HIGH (ref 4.0–10.5)
nRBC: 0 % (ref 0.0–0.2)

## 2020-12-21 LAB — RPR: RPR Ser Ql: NONREACTIVE

## 2020-12-21 NOTE — Progress Notes (Signed)
Subjective: Postpartum Day 1: Cesarean Delivery Patient reports tolerating PO, + flatus, and no problems voiding.    Objective: Vital signs in last 24 hours: Temp:  [97.4 F (36.3 C)-98.5 F (36.9 C)] 98.3 F (36.8 C) (07/20 0620) Pulse Rate:  [63-127] 71 (07/20 0620) Resp:  [0-55] 18 (07/20 0620) BP: (100-140)/(61-99) 109/76 (07/20 0620) SpO2:  [97 %-100 %] 100 % (07/20 0620) Weight:  [82 kg] 82 kg (07/19 1455)  Physical Exam:  General: alert and cooperative Lochia: appropriate Uterine Fundus: firm Incision: C/D/I   Recent Labs    12/20/20 1536 12/21/20 0503  HGB 12.2 9.4*  HCT 35.6* 28.0*    Assessment/Plan: Status post Cesarean section. Doing well postoperatively.  Continue current care.  Monica Payne 12/21/2020, 9:46 AM

## 2020-12-21 NOTE — Lactation Note (Signed)
This note was copied from a baby's chart. Lactation Consultation Note  Patient Name: Monica Payne [id number] Date: 12/21/2020 Reason for consult: Follow-up assessment;Mother's request;Term Age:23 hours  LC assessed with mother how breastfeeding going. Mom states infant feeding well and no pain with the latch. Infant adequate output of urine and stool.   Mom decided to supplement with formula after latching at the breasts. LC reviewed breastfeeding supplementation volume based on hrs of age since delivery and sheet provided.  Infant feeding 2 hrs prior to visit and is resting comfortably.  LC reviewed with mother feeding by cues 8-12x in 24 hr period no more than 4 hrs without an attempt. Mom to offer breasts first then supplement with eBM followed by formula.   Mom set up on DEBP q 3hrs for 15 min. Mom found flange of 21 to be a comfortable fit.   All questions answered in the midst of the visit.   Maternal Data Has patient been taught Hand Expression?: Yes  Feeding Mother's Current Feeding Choice: Breast Milk and Formula Nipple Type: Slow - flow  LATCH Score                    Lactation Tools Discussed/Used Tools: Pump Breast pump type: Double-Electric Breast Pump;Manual Pump Education: Setup, frequency, and cleaning;Milk Storage Reason for Pumping: increase stimulatoin Pumping frequency: every 3 hrs for 15 min  Interventions Interventions: Breast feeding basics reviewed;Education;DEBP  Discharge    Consult Status Consult Status: Follow-up Date: 12/22/20 Follow-up type: In-patient    [name] 12/21/2020, 10:58 PM

## 2020-12-21 NOTE — Anesthesia Postprocedure Evaluation (Signed)
Anesthesia Post Note  Patient: Monica Payne  Procedure(s) Performed: CESAREAN SECTION     Patient location during evaluation: PACU Anesthesia Type: Spinal Level of consciousness: oriented and awake and alert Pain management: pain level controlled Vital Signs Assessment: post-procedure vital signs reviewed and stable Respiratory status: spontaneous breathing, respiratory function stable and nonlabored ventilation Cardiovascular status: blood pressure returned to baseline and stable Postop Assessment: no headache, no backache, no apparent nausea or vomiting, spinal receding and patient able to bend at knees Anesthetic complications: no   No notable events documented.  Pain Goal:                   [name] A.

## 2020-12-22 ENCOUNTER — Encounter (HOSPITAL_COMMUNITY): Payer: Self-pay | Admitting: Obstetrics and Gynecology

## 2020-12-22 ENCOUNTER — Inpatient Hospital Stay (HOSPITAL_COMMUNITY)
Admission: AD | Admit: 2020-12-22 | Payer: BC Managed Care – PPO | Source: Home / Self Care | Admitting: Obstetrics and Gynecology

## 2020-12-22 ENCOUNTER — Inpatient Hospital Stay (HOSPITAL_COMMUNITY): Payer: BC Managed Care – PPO

## 2020-12-22 NOTE — Lactation Note (Addendum)
This note was copied from a baby's chart. Lactation Consultation Note  Patient Name: Monica Payne [id number] Date: 12/22/2020 Reason for consult: Follow-up assessment;Term Age:23 hours  LC in to room for follow up. Mother states breastfeeding is going well. Per mother, infant is clusterfeeding. Mother is pumping right breasts and collects ~6 mL. Mothers prefers pumping one at a time to avoid cramping. Revised flange size and fitted for 24-mm.  Infant is cueing and LC assisted with latch, alignment and support pillows. Audible swallows noted.     Plan: 1-Skin to skin 2-Aim for a deep, comfortable latch 3-Breastfeeding on demand or 8-12 times in 24h period. 4-Keep infant awake during breastfeeding session: massaging breast, infant's hand/shoulder/feet 5-Monitor voids and stools as signs good intake.  6-Encouraged maternal rest, hydration and food intake.  7-Contact LC as needed for feeds/support/concerns/questions   All questions answered at this time.    Maternal Data Has patient been taught Hand Expression?: Yes  Feeding Mother's Current Feeding Choice: Breast Milk  LATCH Score Latch: Grasps breast easily, tongue down, lips flanged, rhythmical sucking.  Audible Swallowing: Spontaneous and intermittent  Type of Nipple: Everted at rest and after stimulation  Comfort (Breast/Nipple): Soft / non-tender  Hold (Positioning): Assistance needed to correctly position infant at breast and maintain latch.  LATCH Score: 9   Lactation Tools Discussed/Used Tools: Pump;Flanges Flange Size: 24 Breast pump type: Double-Electric Breast Pump;Manual Pumped volume: 6 mL (only pumped right breast)  Interventions Interventions: Breast feeding basics reviewed;Assisted with latch;Skin to skin;Breast massage;Hand express;Expressed milk;Education;Position options;Support pillows;Adjust position;Breast compression;Hand pump;DEBP  Discharge Discharge Education: Engorgement and breast  care Pump: DEBP;Manual  Consult Status Consult Status: Follow-up Date: 12/23/20 Follow-up type: In-patient    Yentl Verge A Higuera Ancidey 12/22/2020, 3:38 PM

## 2020-12-22 NOTE — Progress Notes (Signed)
Subjective: Postpartum Day 2: Cesarean Delivery Patient reports incisional pain, tolerating PO, + flatus, + BM, and no problems voiding.  Pumping with great amounts of colostrum noted. She denies HA, CP, SOB. Bonding well with baby. Desires to stay till tomorrow for discharge   Objective: Vital signs in last 24 hours: Temp:  [97.6 F (36.4 C)-98.5 F (36.9 C)] 98.5 F (36.9 C) (07/21 0528) Pulse Rate:  [66-96] 73 (07/21 0528) Resp:  [17-18] 18 (07/21 0528) BP: (97-110)/(63-81) 103/68 (07/21 0528) SpO2:  [99 %-100 %] 100 % (07/21 0528)  Physical Exam:  General: alert, cooperative, and no distress Lochia: appropriate Uterine Fundus: firm Incision: no significant drainage DVT Evaluation: No evidence of DVT seen on physical exam.  Recent Labs    12/20/20 1536 12/21/20 0503  HGB 12.2 9.4*  HCT 35.6* 28.0*    Assessment/Plan: Status post Cesarean section. Doing well postoperatively.  Discharge home tomorrow .  Monica Payne 12/22/2020, 9:58 AM

## 2020-12-23 LAB — SURGICAL PATHOLOGY

## 2020-12-23 MED ORDER — IBUPROFEN 600 MG PO TABS: 600.0000 mg | ORAL_TABLET | Freq: Four times a day (QID) | ORAL | 0 refills | Status: AC

## 2020-12-23 MED ORDER — OXYCODONE HCL 5 MG PO TABS: 5.0000 mg | ORAL_TABLET | ORAL | 0 refills | Status: AC | PRN

## 2020-12-23 NOTE — Discharge Summary (Signed)
Postpartum Discharge Summary      Patient Name: Monica Payne DOB: 08/01/1995 MRN: [id number]  Date of admission: 12/20/2020 Delivery date:12/20/2020  Delivering provider: Carlisle Cater  Date of discharge: 12/23/2020  Admitting diagnosis: [redacted] weeks gestation of pregnancy [Z3A.40] Intrauterine pregnancy: [redacted]w[redacted]d     Secondary diagnosis:  Active Problems:   [redacted] weeks gestation of pregnancy     Discharge diagnosis: Term Pregnancy Delivered and breech presentation in labor                                                Hospital course: Onset of Labor With Unplanned C/S   25 y.o. yo G1P1001 at [redacted]w[redacted]d was admitted in Active Labor on 12/20/2020. Patient had a labor course significant for breech presentation. The patient went for cesarean section due to Malpresentation. Delivery details as follows: Membrane Rupture Time/Date: 4:27 PM ,12/20/2020   Delivery Method:C-Section, Low Transverse  Details of operation can be found in separate operative note. Patient had an uncomplicated postpartum course.  She is ambulating,tolerating a regular diet, passing flatus, and urinating well.  Patient is discharged home in stable condition 12/23/20.  Newborn Data: Birth date:12/20/2020  Birth time:4:27 PM  Gender:Female  Living status:Living  Apgars:8 ,9  Weight:3628 g    Physical exam  Vitals:   12/21/20 1500 12/22/20 0528 12/22/20 1508 12/22/20 2235  BP:  103/68 115/80 126/89  Pulse: 96 73 81 76  Resp: 18 18 20 18   Temp:  98.5 F (36.9 C) 98.1 F (36.7 C) 98.5 F (36.9 C)  TempSrc:  Oral Oral Oral  SpO2: 100% 100%    Weight:      Height:       General: alert Lochia: appropriate Uterine Fundus: firm Incision: Healing well with no significant drainage  Labs: Lab Results  Component Value Date   WBC 14.4 (H) 12/21/2020   HGB 9.4 (L) 12/21/2020   HCT 28.0 (L) 12/21/2020   MCV 87.2 12/21/2020   PLT 141 (L) 12/21/2020   No flowsheet data found. Edinburgh Score: Edinburgh  Postnatal Depression Scale Screening Tool 12/22/2020  I have been able to laugh and see the funny side of things. 0  I have looked forward with enjoyment to things. 0  I have blamed myself unnecessarily when things went wrong. 1  I have been anxious or worried for no good reason. 2  I have felt scared or panicky for no good reason. 1  Things have been getting on top of me. 2  I have been so unhappy that I have had difficulty sleeping. 0  I have felt sad or miserable. 0  I have been so unhappy that I have been crying. 0  The thought of harming myself has occurred to me. 0  Edinburgh Postnatal Depression Scale Total 6      After visit meds:  Allergies as of 12/23/2020   No Known Allergies      Medication List     TAKE these medications    ibuprofen 600 MG tablet Commonly known as: ADVIL Take 1 tablet (600 mg total) by mouth every 6 (six) hours.   oxyCODONE 5 MG immediate release tablet Commonly known as: Oxy IR/ROXICODONE Take 1 tablet (5 mg total) by mouth every 4 (four) hours as needed for severe pain.   Prenatal Vitamins 28-0.8 MG Tabs Take 1  tablet by mouth daily.               Discharge Care Instructions  (From admission, onward)           Start     Ordered   12/23/20 0000  Discharge wound care:       Comments: Remove honeycomb on POD #5   12/23/20 0754             Discharge home in stable condition Infant Feeding: Breast Infant Disposition:home with mother Discharge instruction: per After Visit Summary and Postpartum booklet. Activity: Advance as tolerated. Pelvic rest for 6 weeks.  Diet: routine diet Postpartum Appointment:2 weeks Follow up Visit:  Follow-up Information     Shivaji, Valerie Roys, MD. Schedule an appointment as soon as possible for a visit in 2 week(s).   Specialty: Obstetrics and Gynecology Contact information: [address] [place] [address] [phone]                     12/23/2020 Zenaida Niece, MD

## 2020-12-23 NOTE — Discharge Instructions (Signed)
As per discharge pamphlet °

## 2020-12-23 NOTE — Progress Notes (Signed)
POD #3 LTCS Doing ok, still sore Afeb, VSS Abd- soft, fundus firm, incision intact D/c home

## 2020-12-31 ENCOUNTER — Telehealth (HOSPITAL_COMMUNITY): Payer: Self-pay

## 2020-12-31 NOTE — Telephone Encounter (Signed)
No answer. Left message to return nurse call.  Marcelino Duster [facility] 12/31/2020,1238

## 2021-12-03 IMAGING — US US OB COMP LESS 14 WK
1 series · 14 of 28 positions shown · non-contrast
Comparison: None.

CLINICAL DATA: Vaginal bleeding, positive pregnancy test

EXAM:
OBSTETRIC <14 WK ULTRASOUND
TECHNIQUE: Transabdominal ultrasound was performed for evaluation of the
gestation as well as the maternal uterus and adnexal regions.

[Series 1: us ob comp less 14 wk · 50 acquisitions, 14 frames shown]
[im 2/50]
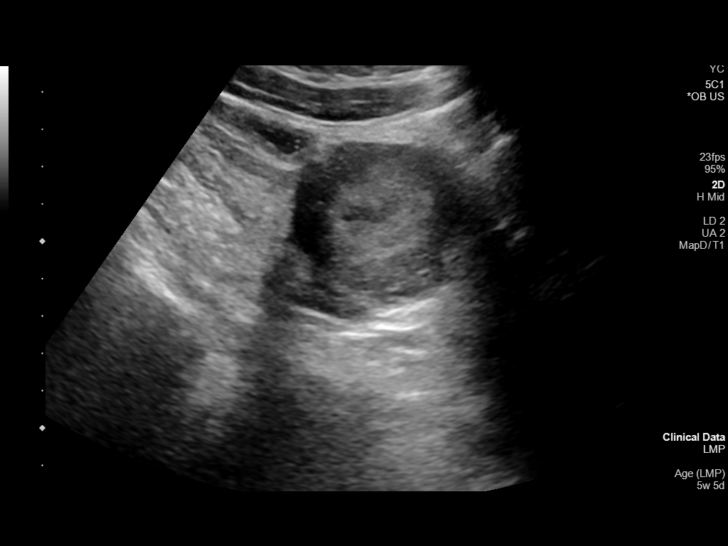
[im 6/50]
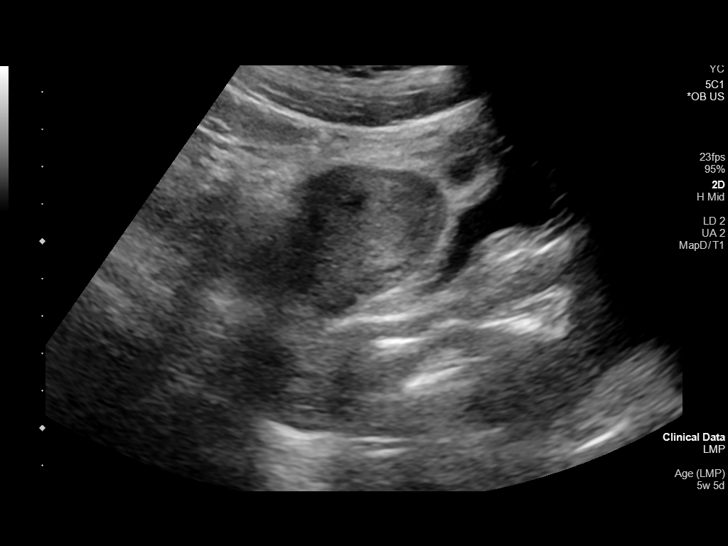
[im 10/50]
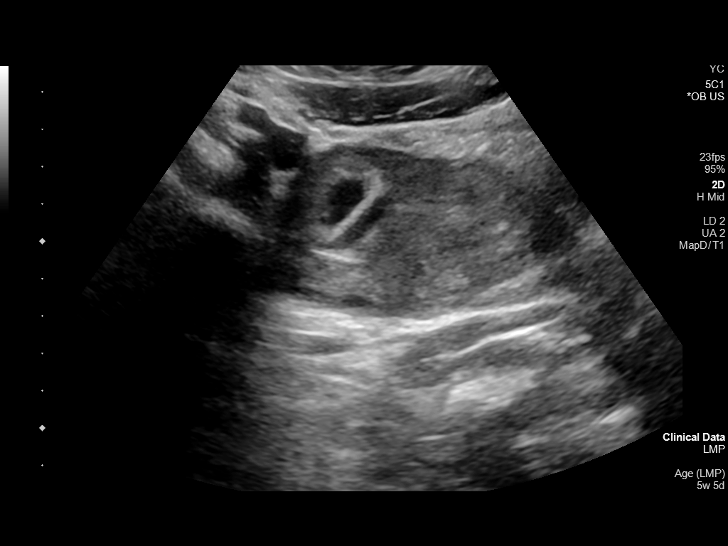
[im 13/50]
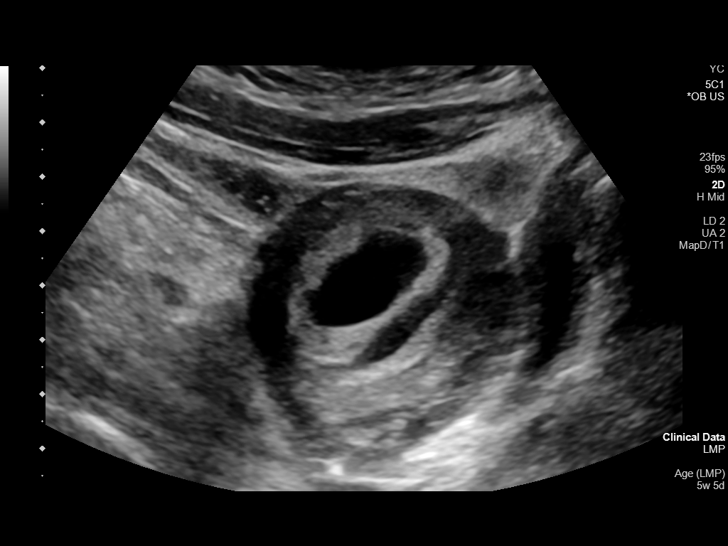
[im 17/50]
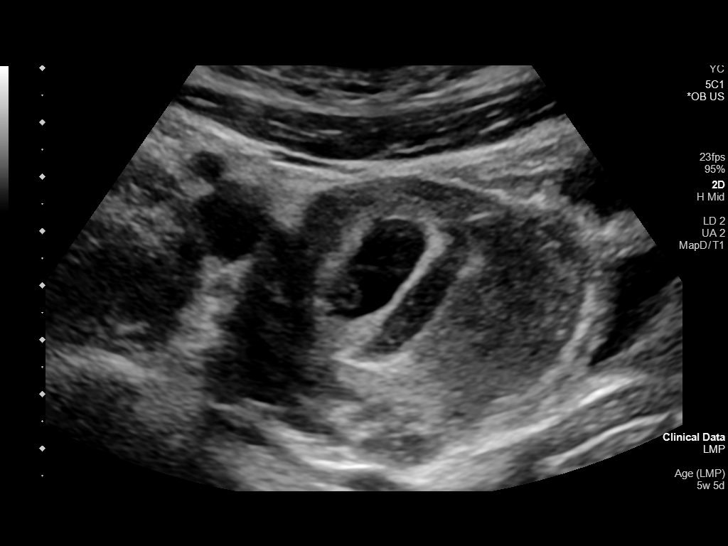
[im 20/50]
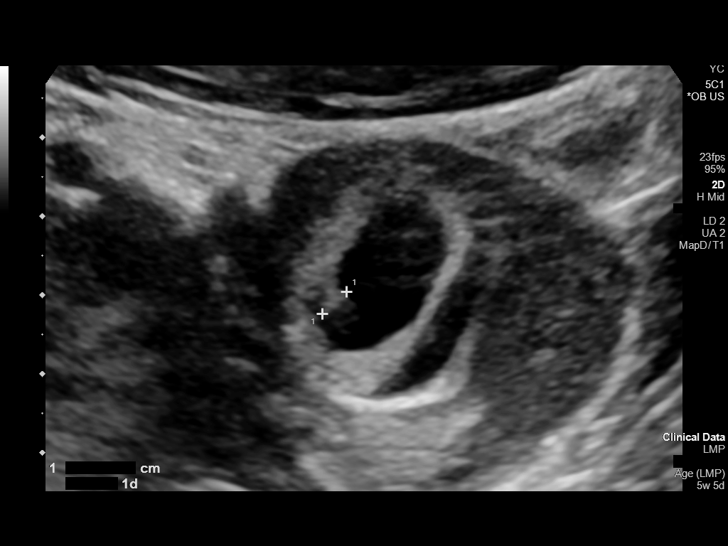
[im 24/50]
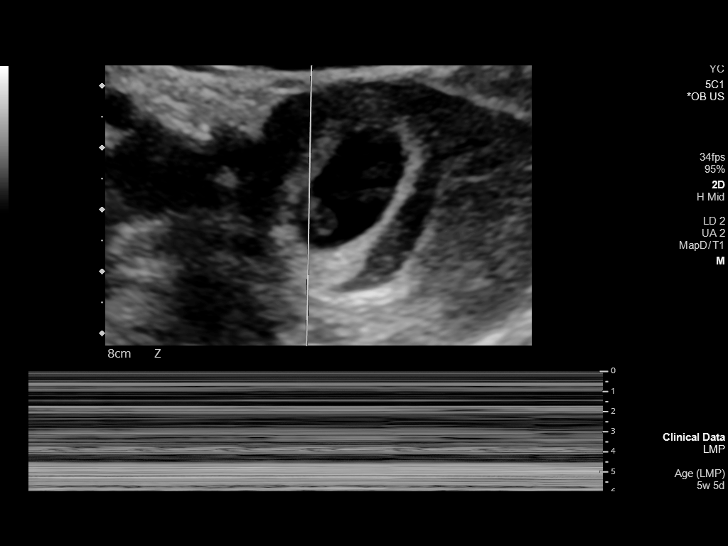
[im 28/50]
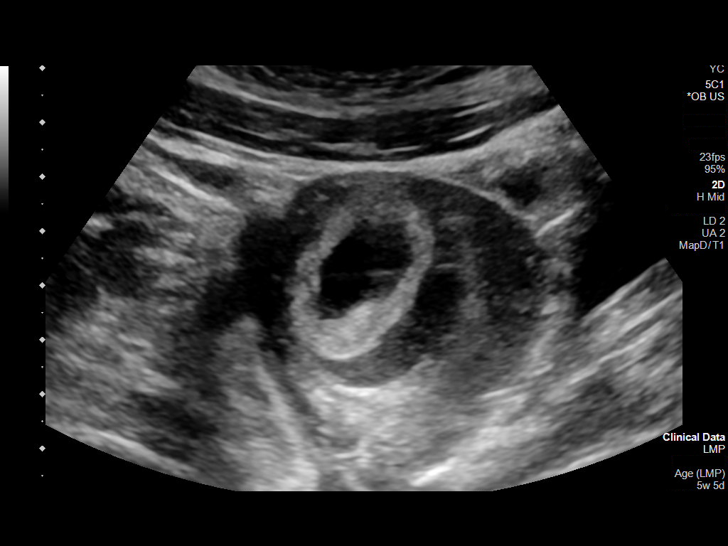
[im 31/50]
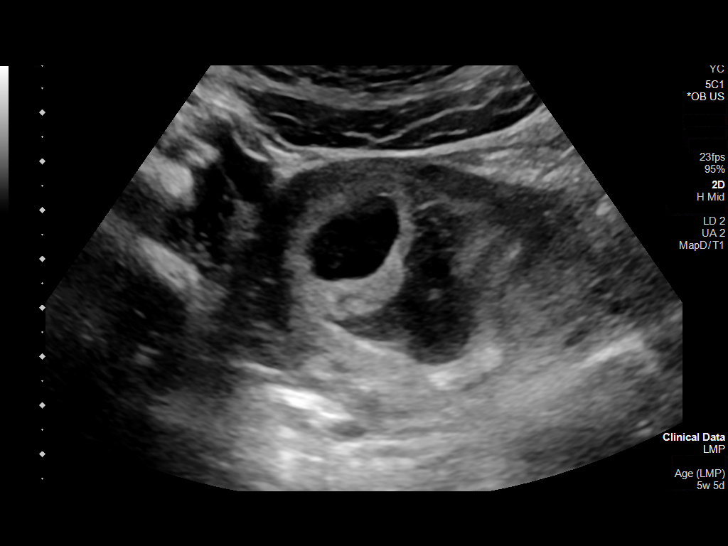
[im 35/50]
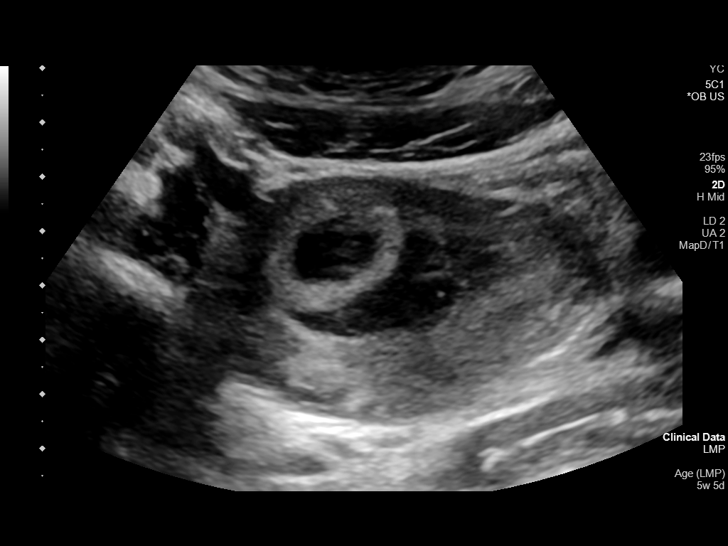
[im 39/50]
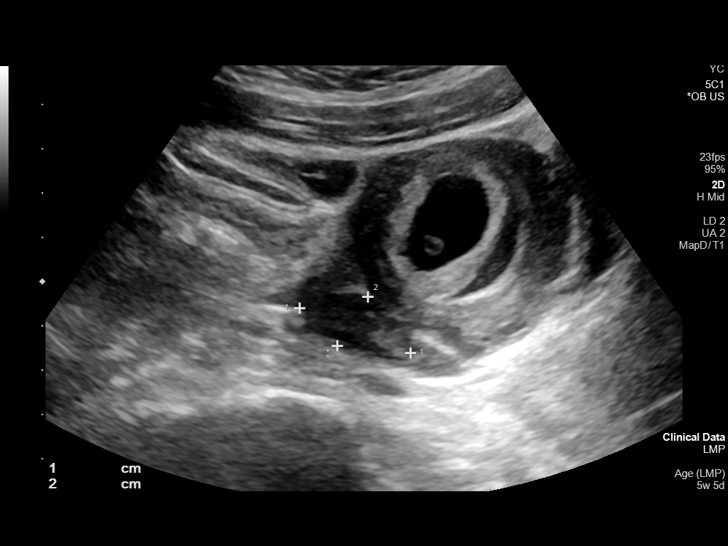
[im 42/50]
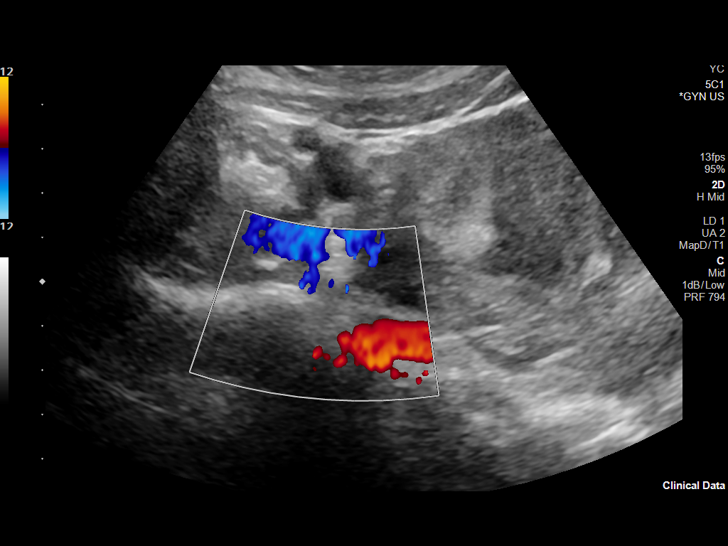
[im 46/50]
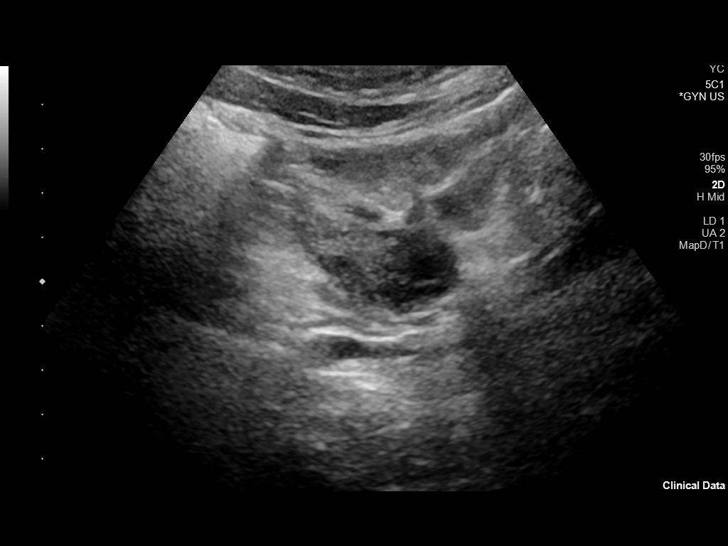
[im 50/50]
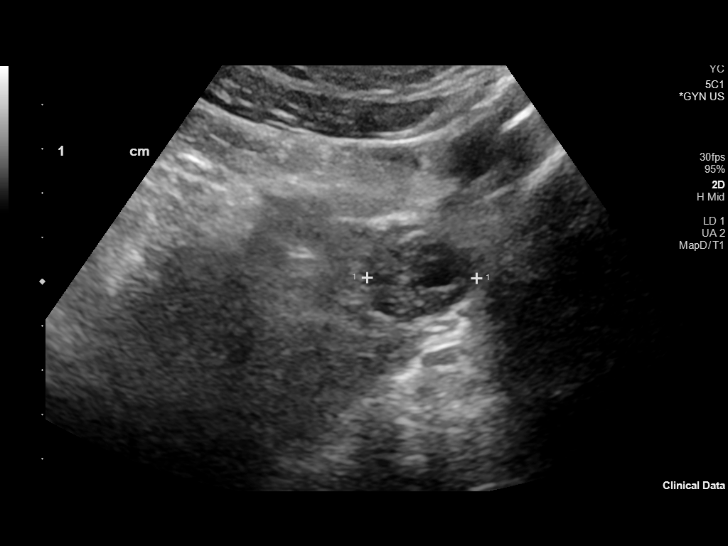

[14 of 28 positions shown; findings below may reference images not displayed]

FINDINGS: Intrauterine gestational sac: Present

Yolk sac:  Present

Embryo:  Present

Cardiac Activity: Present

Heart Rate: 120 bpm

CRL:   4.5 mm   6 w 1 d                  US EDC: 12/25/2020

Subchorionic hemorrhage: There is a large subchorionic hemorrhage
identified which measures 3.7 cm in greatest dimension.

Maternal uterus/adnexae: Within normal limits. No free fluid is
noted.
IMPRESSION: Single live intrauterine gestation at 6 weeks 1 day.

Large subchorionic hemorrhage measuring 3.7 cm in greatest
dimension.
